# Patient Record
Sex: Male | Born: 1995 | Race: Black or African American | Hispanic: No | Marital: Single | Smoking: Current every day smoker
Health system: Southern US, Community
[De-identification: ages and names within clinical notes are randomized; demographics above are authoritative.]

## PROBLEM LIST (undated history)

## (undated) HISTORY — PX: KNEE ARTHROSCOPY: SUR90

---
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
## 2017-02-07 ENCOUNTER — Ambulatory Visit (HOSPITAL_COMMUNITY)
Admission: EM | Admit: 2017-02-07 | Discharge: 2017-02-07 | Disposition: A | Discharge status: Home / Self Care | Payer: Self-pay | Attending: Family Medicine | Admitting: Family Medicine

## 2017-02-07 ENCOUNTER — Encounter (HOSPITAL_COMMUNITY): Payer: Self-pay | Admitting: Emergency Medicine

## 2017-02-07 DIAGNOSIS — N342 Other urethritis: Secondary | ICD-10-CM | POA: Insufficient documentation

## 2017-02-07 DIAGNOSIS — A64 Unspecified sexually transmitted disease: Secondary | ICD-10-CM

## 2017-02-07 DIAGNOSIS — Z202 Contact with and (suspected) exposure to infections with a predominantly sexual mode of transmission: Secondary | ICD-10-CM | POA: Insufficient documentation

## 2017-02-07 DIAGNOSIS — F17210 Nicotine dependence, cigarettes, uncomplicated: Secondary | ICD-10-CM | POA: Insufficient documentation

## 2017-02-07 DIAGNOSIS — R350 Frequency of micturition: Secondary | ICD-10-CM | POA: Insufficient documentation

## 2017-02-07 LAB — POCT URINALYSIS DIP (DEVICE)
Bilirubin Urine: NEGATIVE
Glucose, UA: NEGATIVE mg/dL
HGB URINE DIPSTICK: NEGATIVE
Ketones, ur: NEGATIVE mg/dL
NITRITE: NEGATIVE
PROTEIN: NEGATIVE mg/dL
Specific Gravity, Urine: 1.025 (ref 1.005–1.030)
UROBILINOGEN UA: 1 mg/dL (ref 0.0–1.0)
pH: 6.5 (ref 5.0–8.0)

## 2017-02-07 MED ORDER — PHENAZOPYRIDINE HCL 200 MG PO TABS: 200.0000 mg | ORAL_TABLET | Freq: Three times a day (TID) | ORAL | 0 refills | Status: AC

## 2017-02-07 MED ORDER — AZITHROMYCIN 250 MG PO TABS
ORAL_TABLET | ORAL | Status: AC
  Filled 2017-02-07: qty 4

## 2017-02-07 MED ORDER — CEFTRIAXONE SODIUM 250 MG IJ SOLR
250.0000 mg | Freq: Once | INTRAMUSCULAR | Status: AC
  Administered 2017-02-07: 250 mg via INTRAMUSCULAR

## 2017-02-07 MED ORDER — LIDOCAINE HCL (PF) 1 % IJ SOLN
INTRAMUSCULAR | Status: AC
  Filled 2017-02-07: qty 2

## 2017-02-07 MED ORDER — CEFTRIAXONE SODIUM 250 MG IJ SOLR
INTRAMUSCULAR | Status: AC
  Filled 2017-02-07: qty 250

## 2017-02-07 MED ORDER — AZITHROMYCIN 250 MG PO TABS
1000.0000 mg | ORAL_TABLET | Freq: Once | ORAL | Status: AC
  Administered 2017-02-07: 1000 mg via ORAL

## 2017-02-07 NOTE — ED Provider Notes (Signed)
CSN: [id number]     Arrival date & time 02/07/17  1810 History   None    Chief Complaint  Patient presents with  . Urinary Frequency   (Consider location/radiation/quality/duration/timing/severity/associated sxs/prior Treatment) Patient c/o urinary frequency and bladder pressure for last 2 days.   The history is provided by the patient.  Exposure to STD  This is a new problem. The problem occurs constantly. The problem has not changed since onset.Nothing aggravates the symptoms. Nothing relieves the symptoms.    History reviewed. No pertinent past medical history. Past Surgical History:  Procedure Laterality Date  . KNEE ARTHROSCOPY Right    History reviewed. No pertinent family history. Social History  Substance Use Topics  . Smoking status: Current Every Day Smoker    Packs/day: 1.00    Years: 5.00  . Smokeless tobacco: Never Used  . Alcohol use No    Review of Systems  Constitutional: Negative.   HENT: Negative.   Eyes: Negative.   Respiratory: Negative.   Gastrointestinal: Negative.   Endocrine: Negative.   Genitourinary: Positive for frequency.  Musculoskeletal: Negative.   Allergic/Immunologic: Negative.   Neurological: Negative.     Allergies  Patient has no known allergies.  Home Medications   Prior to Admission medications   Medication Sig Start Date End Date Taking? Authorizing Provider  phenazopyridine (PYRIDIUM) 200 MG tablet Take 1 tablet (200 mg total) by mouth 3 (three) times daily. 02/07/17   Deatra Canterxford, William J, FNP   Meds Ordered and Administered this Visit   Medications  cefTRIAXone (ROCEPHIN) injection 250 mg (250 mg Intramuscular Given 02/07/17 1836)  azithromycin (ZITHROMAX) tablet 1,000 mg (1,000 mg Oral Given 02/07/17 1840)    BP 104/63 (BP Location: Left Arm)   Pulse 84   Temp 98.3 F (36.8 C) (Oral)   SpO2 98%  No data found.   Physical Exam  Constitutional: He appears well-developed and well-nourished.  HENT:  Head:  Normocephalic and atraumatic.  Eyes: Conjunctivae and EOM are normal. Pupils are equal, round, and reactive to light.  Neck: Normal range of motion. Neck supple.  Cardiovascular: Normal rate, regular rhythm and normal heart sounds.   Pulmonary/Chest: Effort normal and breath sounds normal.  Nursing note and vitals reviewed.   Urgent Care Course     Procedures (including critical care time)  Labs Review Labs Reviewed  POCT URINALYSIS DIP (DEVICE) - Abnormal; Notable for the following:       Result Value   Leukocytes, UA TRACE (*)    All other components within normal limits  URINE CYTOLOGY ANCILLARY ONLY    Imaging Review No results found.   Visual Acuity Review  Right Eye Distance:   Left Eye Distance:   Bilateral Distance:    Right Eye Near:   Left Eye Near:    Bilateral Near:         MDM   1. STD (male)   2. Urethritis    Azithromycin 250mg  x 4 Rocephin 250mg  IM  Pyridium 200mg  one po tid x 2 days #6      Deatra CanterOxford, William J, [facility] Memorial HospitalFNP 02/07/17 [id number]

## 2017-02-07 NOTE — ED Triage Notes (Signed)
Pt reports urinary frequency and bladder pressure for the last two days.

## 2017-02-08 LAB — URINE CYTOLOGY ANCILLARY ONLY
Chlamydia: NEGATIVE
Neisseria Gonorrhea: NEGATIVE
Trichomonas: NEGATIVE

## 2017-05-14 ENCOUNTER — Ambulatory Visit (HOSPITAL_COMMUNITY)
Admission: EM | Admit: 2017-05-14 | Discharge: 2017-05-14 | Disposition: A | Discharge status: Home / Self Care | Payer: Self-pay | Attending: Internal Medicine | Admitting: Internal Medicine

## 2017-05-14 ENCOUNTER — Encounter (HOSPITAL_COMMUNITY): Payer: Self-pay | Admitting: *Deleted

## 2017-05-14 DIAGNOSIS — Z202 Contact with and (suspected) exposure to infections with a predominantly sexual mode of transmission: Secondary | ICD-10-CM | POA: Insufficient documentation

## 2017-05-14 DIAGNOSIS — Z113 Encounter for screening for infections with a predominantly sexual mode of transmission: Secondary | ICD-10-CM

## 2017-05-14 DIAGNOSIS — L291 Pruritus scroti: Secondary | ICD-10-CM

## 2017-05-14 DIAGNOSIS — F17210 Nicotine dependence, cigarettes, uncomplicated: Secondary | ICD-10-CM | POA: Insufficient documentation

## 2017-05-14 LAB — POCT URINALYSIS DIP (DEVICE)
BILIRUBIN URINE: NEGATIVE
Glucose, UA: NEGATIVE mg/dL
Hgb urine dipstick: NEGATIVE
KETONES UR: NEGATIVE mg/dL
Leukocytes, UA: NEGATIVE
Nitrite: NEGATIVE
PH: 5.5 (ref 5.0–8.0)
PROTEIN: NEGATIVE mg/dL
SPECIFIC GRAVITY, URINE: 1.02 (ref 1.005–1.030)
Urobilinogen, UA: 0.2 mg/dL (ref 0.0–1.0)

## 2017-05-14 MED ORDER — NYSTATIN 100000 UNIT/GM EX POWD: Freq: Four times a day (QID) | CUTANEOUS | 0 refills | Status: AC

## 2017-05-14 MED ORDER — CLOTRIMAZOLE 1 % EX CREA: TOPICAL_CREAM | CUTANEOUS | 0 refills | Status: AC

## 2017-05-14 NOTE — ED Provider Notes (Signed)
[facility]    CSN: [id number] Arrival date & time: 05/14/17  1315     History   Chief Complaint Chief Complaint  Patient presents with  . Exposure to STD    HPI Ronald Turner is a 21 y.o. male.   Pt states that his girlfriend had "a bacterial infection" and he now has a rash on his scrotum. Denies dysuria or discharge. Denies genital ulcers       History reviewed. No pertinent past medical history.  There are no active problems to display for this patient.   Past Surgical History:  Procedure Laterality Date  . KNEE ARTHROSCOPY Right        Home Medications    Prior to Admission medications   Medication Sig Start Date End Date Taking? Authorizing Provider  clotrimazole (LOTRIMIN) 1 % cream Apply to affected area 2 times daily 05/14/17   Arnaldo Natal, MD  nystatin (MYCOSTATIN/NYSTOP) powder Apply topically 4 (four) times daily. 05/14/17   Arnaldo Natal, MD  phenazopyridine (PYRIDIUM) 200 MG tablet Take 1 tablet (200 mg total) by mouth 3 (three) times daily. 02/07/17   Deatra Canter, FNP    Family History History reviewed. No pertinent family history.  Social History Social History  Substance Use Topics  . Smoking status: Current Every Day Smoker    Packs/day: 1.00    Years: 5.00  . Smokeless tobacco: Never Used  . Alcohol use No     Allergies   Patient has no known allergies.   Review of Systems Review of Systems  Constitutional: Negative for chills and fever.  HENT: Negative for sore throat and tinnitus.   Eyes: Negative for redness.  Respiratory: Negative for cough and shortness of breath.   Cardiovascular: Negative for chest pain and palpitations.  Gastrointestinal: Negative for abdominal pain, diarrhea, nausea and vomiting.  Genitourinary: Negative for dysuria, frequency and urgency.  Musculoskeletal: Negative for myalgias.  Skin: Positive for rash (scrotum).       No lesions  Neurological: Negative for weakness.    Hematological: Does not bruise/bleed easily.  Psychiatric/Behavioral: Negative for suicidal ideas.     Physical Exam Triage Vital Signs ED Triage Vitals [05/14/17 1418]  Enc Vitals Group     BP 122/72     Pulse Rate 78     Resp 18     Temp 98.6 F (37 C)     Temp Source Oral     SpO2 100 %     Weight      Height      Head Circumference      Peak Flow      Pain Score      Pain Loc      Pain Edu?      Excl. in GC?    No data found.   Updated Vital Signs BP 122/72 (BP Location: Right Arm)   Pulse 78   Temp 98.6 F (37 C) (Oral)   Resp 18   SpO2 100%   Visual Acuity Right Eye Distance:   Left Eye Distance:   Bilateral Distance:    Right Eye Near:   Left Eye Near:    Bilateral Near:     Physical Exam  Constitutional: He is oriented to person, place, and time. He appears well-developed and well-nourished. No distress.  HENT:  Head: Normocephalic and atraumatic.  Mouth/Throat: Oropharynx is clear and moist.  Eyes: Pupils are equal, round, and reactive to light. Conjunctivae and EOM are normal. No  scleral icterus.  Neck: Normal range of motion. Neck supple. No JVD present. No tracheal deviation present. No thyromegaly present.  Cardiovascular: Normal rate, regular rhythm and normal heart sounds.  Exam reveals no gallop and no friction rub.   No murmur heard. Pulmonary/Chest: Effort normal and breath sounds normal. No respiratory distress.  Abdominal: Soft. Bowel sounds are normal. He exhibits no distension. There is no tenderness.  Musculoskeletal: Normal range of motion. He exhibits no edema.  Lymphadenopathy:    He has no cervical adenopathy.  Neurological: He is alert and oriented to person, place, and time. No cranial nerve deficit.  Skin: Skin is warm and dry. No rash noted. No erythema.  Psychiatric: He has a normal mood and affect. His behavior is normal. Judgment and thought content normal.     UC Treatments / Results  Labs (all labs ordered are  listed, but only abnormal results are displayed) Labs Reviewed  RPR  HEPATITIS PANEL, ACUTE  HIV ANTIBODY (ROUTINE TESTING)  POCT URINALYSIS DIP (DEVICE)  URINE CYTOLOGY ANCILLARY ONLY    EKG  EKG Interpretation None       Radiology No results found.  Procedures Procedures (including critical care time)  Medications Ordered in UC Medications - No data to display   Initial Impression / Assessment and Plan / UC Course  I have reviewed the triage vital signs and the nursing notes.  Pertinent labs & imaging results that were available during my care of the patient were reviewed by me and considered in my medical decision making (see chart for details).     Empirically treat tinea crura. Screen for HIV, Hep B/C, RPR, GC/Chlamydia. Will notify patient of results  Final Clinical Impressions(s) / UC Diagnoses   Final diagnoses:  STD exposure    New Prescriptions New Prescriptions   CLOTRIMAZOLE (LOTRIMIN) 1 % CREAM    Apply to affected area 2 times daily   NYSTATIN (MYCOSTATIN/NYSTOP) POWDER    Apply topically 4 (four) times daily.     Controlled Substance Prescriptions [place] Controlled Substance Registry consulted? Not Applicable   Arnaldo Natal, MD 05/14/17 [id number]

## 2017-05-14 NOTE — ED Triage Notes (Signed)
Pt  Reports     His   Girlfriend  Has  A  Bacterial    Infection  He  Denies  Any  Symptoms  Other   Than a  Rash      In  Private  Area

## 2017-05-15 LAB — RPR: RPR Ser Ql: NONREACTIVE

## 2017-05-15 LAB — HEPATITIS PANEL, ACUTE
HCV Ab: <0.1 s/co ratio (ref 0.0–0.9)
Hep A IgM: NEGATIVE
Hep B C IgM: NEGATIVE
Hepatitis B Surface Ag: NEGATIVE

## 2017-05-15 LAB — HIV ANTIBODY (ROUTINE TESTING W REFLEX): HIV Screen 4th Generation wRfx: NONREACTIVE

## 2017-05-17 LAB — URINE CYTOLOGY ANCILLARY ONLY
Chlamydia: NEGATIVE
Neisseria Gonorrhea: NEGATIVE
Trichomonas: NEGATIVE

## 2017-12-28 ENCOUNTER — Encounter (HOSPITAL_COMMUNITY): Payer: Self-pay | Admitting: Emergency Medicine

## 2017-12-28 ENCOUNTER — Other Ambulatory Visit: Payer: Self-pay

## 2017-12-28 ENCOUNTER — Emergency Department (HOSPITAL_COMMUNITY)
Admission: EM | Admit: 2017-12-28 | Discharge: 2017-12-28 | Disposition: A | Discharge status: Home / Self Care | Payer: Self-pay | Attending: Emergency Medicine | Admitting: Emergency Medicine

## 2017-12-28 DIAGNOSIS — F17210 Nicotine dependence, cigarettes, uncomplicated: Secondary | ICD-10-CM | POA: Insufficient documentation

## 2017-12-28 DIAGNOSIS — Z79899 Other long term (current) drug therapy: Secondary | ICD-10-CM | POA: Insufficient documentation

## 2017-12-28 DIAGNOSIS — J069 Acute upper respiratory infection, unspecified: Secondary | ICD-10-CM | POA: Insufficient documentation

## 2017-12-28 LAB — GROUP A STREP BY PCR: GROUP A STREP BY PCR: NOT DETECTED

## 2017-12-28 MED ORDER — PENICILLIN G BENZATHINE & PROC 1200000 UNIT/2ML IM SUSP
1.2000 10*6.[IU] | Freq: Once | INTRAMUSCULAR | Status: AC
  Administered 2017-12-28: 1.2 10*6.[IU] via INTRAMUSCULAR
  Filled 2017-12-28: qty 2

## 2017-12-28 MED ORDER — ACETAMINOPHEN 325 MG PO TABS: 650.0000 mg | ORAL_TABLET | Freq: Four times a day (QID) | ORAL | 0 refills | Status: AC | PRN

## 2017-12-28 MED ORDER — IBUPROFEN 400 MG PO TABS: 400.0000 mg | ORAL_TABLET | Freq: Four times a day (QID) | ORAL | 0 refills | Status: AC | PRN

## 2017-12-28 NOTE — Discharge Instructions (Signed)
You may alternate taking Tylenol and Ibuprofen as needed for pain control. You may take 400-600 mg of ibuprofen every 6 hours and [phone] mg of Tylenol every 6 hours. Do not exceed 4000 mg of Tylenol daily as this can lead to liver damage. Also, make sure to take Ibuprofen with meals as it can cause an upset stomach. Do not take other NSAIDs while taking Ibuprofen such as (Aleve, Naprosyn, Aspirin, Celebrex, etc) and do not take more than the prescribed dose as this can lead to ulcers and bleeding in your GI tract.   Over the next several days you should rest as much as possible, and drink more fluids than usual.   To help soothe a sore throat gargle with warm salt water.  Make salt water by dissolving  teaspoon salt in 1 cup warm water. You may also use throat lozenges and over the counter sore throat spray.  Please follow up with your primary care provider within 5-7 days for re-evaluation of your symptoms. If you do not have a primary care provider, information for a healthcare clinic has been provided for you to make arrangements for follow up care. Please return to the emergency department for any persistent fevers, worsening sore throat/hoarse voice, inability to swallow, persistent vomiting, chest pain, shortness of breath, coughing up blood, or any new or worsening symptoms.

## 2017-12-28 NOTE — ED Triage Notes (Signed)
Pt reports sore throat and generalized fatigue since yesterday. Denies cough.

## 2017-12-28 NOTE — ED Provider Notes (Signed)
[facility] EMERGENCY DEPARTMENT Provider Note   CSN: [id number] Arrival date & time: 12/28/17  1013     History   Chief Complaint Chief Complaint  Patient presents with  . URI    HPI Ronald Turner is a 22 y.o. male.  HPI   Is a 22 year old male presents the ED today to be evaluated for a sore throat that began yesterday.  Patient states that he began to have a gradual onset headache yesterday and felt fatigued.  This is not the worst headache of life it was diffuse in nature.  It is since resolved completely.  After onset of headache he started to develop a sore throat as well.  Denies associated rhinorrhea, congestion, cough, shortness of breath, chest pain.  Denies abdominal pain, nausea, vomiting, diarrhea or urinary symptoms.  Denies bilateral ear pain.  Denies urinary or bowel symptoms.  States he has not been around anyone with similar symptoms.  He does have a 72-month-old at home.  He did not receive his flu shot this year.  He denies any fevers.  Denies any neck stiffness. Denies dizziness, vision changes, numbness, weakness.   History reviewed. No pertinent past medical history.  There are no active problems to display for this patient.   Past Surgical History:  Procedure Laterality Date  . KNEE ARTHROSCOPY Right         Home Medications    Prior to Admission medications   Medication Sig Start Date End Date Taking? Authorizing Provider  acetaminophen (TYLENOL) 325 MG tablet Take 2 tablets (650 mg total) by mouth every 6 (six) hours as needed. Do not take more than  of tylenol per day 12/28/17   Eitan Doubleday S, PA-C  clotrimazole (LOTRIMIN) 1 % cream Apply to affected area 2 times daily 05/14/17   Arnaldo Natal, MD  ibuprofen (ADVIL,MOTRIN) 400 MG tablet Take 1 tablet (400 mg total) by mouth every 6 (six) hours as needed. 12/28/17   Hadlea Furuya S, PA-C  nystatin (MYCOSTATIN/NYSTOP) powder Apply topically 4 (four) times daily. 05/14/17    Arnaldo Natal, MD  phenazopyridine (PYRIDIUM) 200 MG tablet Take 1 tablet (200 mg total) by mouth 3 (three) times daily. 02/07/17   Deatra Canter, FNP    Family History No family history on file.  Social History Social History   Tobacco Use  . Smoking status: Current Every Day Smoker    Packs/day: 1.00    Years: 5.00    Pack years: 5.00  . Smokeless tobacco: Never Used  Substance Use Topics  . Alcohol use: No  . Drug use: No     Allergies   Patient has no known allergies.   Review of Systems Review of Systems  Constitutional: Positive for fatigue. Negative for chills and fever.  HENT: Positive for sore throat. Negative for congestion, ear pain and rhinorrhea.   Eyes: Negative for pain and visual disturbance.  Respiratory: Negative for cough and shortness of breath.   Cardiovascular: Negative for chest pain and palpitations.  Gastrointestinal: Negative for abdominal pain, constipation, diarrhea, nausea and vomiting.  Genitourinary: Negative for dysuria and hematuria.  Musculoskeletal: Negative for arthralgias, back pain and neck stiffness.  Skin: Negative for color change and rash.  Neurological: Positive for headaches (resolved). Negative for dizziness, seizures, syncope, weakness and numbness.  All other systems reviewed and are negative.  Physical Exam Updated Vital Signs BP 139/73   Pulse 98   Temp 98.2 F (36.8 C) (Oral)   Resp  15   SpO2 99%   Physical Exam  Constitutional: He appears well-developed and well-nourished.  HENT:  Head: Normocephalic and atraumatic.  Bilateral TMs without erythema or effusion.  Pharyngeal erythema noted.  Tonsils 1+ bilaterally.  No tonsillar exudates noted.  No evidence of PTA.  Normal voice.  Tolerating secretions.  Patient airway.  Nose normal.  Eyes: Pupils are equal, round, and reactive to light. Conjunctivae and EOM are normal.  Neck: Normal range of motion. Neck supple.  No nuchal rigidity  Cardiovascular:  Normal rate, regular rhythm and normal heart sounds.  No murmur heard. Pulmonary/Chest: Effort normal and breath sounds normal. No respiratory distress. He has no wheezes.  Abdominal: Soft. Bowel sounds are normal. He exhibits no distension. There is no tenderness. There is no guarding.  Musculoskeletal: He exhibits no edema.  Lymphadenopathy:    He has cervical adenopathy.  Neurological: He is alert.  Mental Status:  Alert, thought content appropriate, able to give a coherent history. Speech fluent without evidence of aphasia. Able to follow 2 step commands without difficulty.  Cranial Nerves:  II: pupils equal, round, reactive to light III,IV, VI: ptosis not present, extra-ocular motions intact bilaterally  V,VII: smile symmetric, facial light touch sensation equal VIII: hearing grossly normal to voice  X: uvula elevates symmetrically  XI: bilateral shoulder shrug symmetric and strong XII: midline tongue extension without fassiculations Motor:  Normal tone. 5/5 strength of BUE and BLE major muscle groups including strong and equal grip strength and dorsiflexion/plantar flexion Sensory: light touch normal in all extremities. CV: 2+ radial and DP/PT pulses  Skin: Skin is warm and dry.  Psychiatric: He has a normal mood and affect.  Nursing note and vitals reviewed.  ED Treatments / Results  Labs (all labs ordered are listed, but only abnormal results are displayed) Labs Reviewed  GROUP A STREP BY PCR    EKG None  Radiology No results found.  Procedures Procedures (including critical care time)  Medications Ordered in ED Medications  penicillin g procaine-penicillin g benzathine (BICILLIN-CR) injection 600000-600000 units (has no administration in time range)     Initial Impression / Assessment and Plan / ED Course  I have reviewed the triage vital signs and the nursing notes.  Pertinent labs & imaging results that were available during my care of the patient were  reviewed by me and considered in my medical decision making (see chart for details).     Final Clinical Impressions(s) / ED Diagnoses   Final diagnoses:  Upper respiratory tract infection, unspecified type   Patient presenting with sore throat for 2 days.  He is afebrile in the ED with normal vital signs.  He is nontoxic-appearing and in no acute distress.  Does have some pharyngeal erythema and cervical adenopathy.  Because he does not have a cough and he does have pharyngeal erythema and cervical adenopathy will likely treat for suspected strep throat with IM penicillin.  Strep was negative however may be false negative.  Patient has no evidence of PTA or retropharyngeal abscess.  No meningeal signs.  Normal neurologic exam.  Have advised him to follow-up if he does not feel better within the next several days.  Gave resources for primary care providers and gave strict return precautions for any new or worsening symptoms.  Advised Tylenol and ibuprofen for symptoms of sore throat.  All questions answered patient understands the plan.  ED Discharge Orders        Ordered    acetaminophen (TYLENOL) 325  MG tablet  Every 6 hours PRN     12/28/17 1245    ibuprofen (ADVIL,MOTRIN) 400 MG tablet  Every 6 hours PRN     12/28/17 1245       Lamira Borin S, PA-C 12/28/17 1246    Pricilla Loveless, MD 12/29/17 [phone]

## 2020-01-01 ENCOUNTER — Other Ambulatory Visit: Payer: Self-pay

## 2020-01-01 ENCOUNTER — Emergency Department (HOSPITAL_COMMUNITY): Admission: EM | Admit: 2020-01-01 | Discharge: 2020-01-01 | Discharge status: Against Medical Advice | Payer: Self-pay

## 2020-01-01 NOTE — ED Notes (Signed)
Pt called multiple times for triage, no answer. 

## 2021-03-11 ENCOUNTER — Emergency Department (HOSPITAL_COMMUNITY): Admission: EM | Admit: 2021-03-11 | Discharge: 2021-03-11 | Discharge status: Against Medical Advice | Payer: Self-pay

## 2021-03-11 ENCOUNTER — Other Ambulatory Visit: Payer: Self-pay

## 2021-03-12 ENCOUNTER — Emergency Department (HOSPITAL_BASED_OUTPATIENT_CLINIC_OR_DEPARTMENT_OTHER)
Admission: EM | Admit: 2021-03-12 | Discharge: 2021-03-12 | Disposition: A | Discharge status: Home / Self Care | Payer: Self-pay | Attending: Emergency Medicine | Admitting: Emergency Medicine

## 2021-03-12 ENCOUNTER — Encounter (HOSPITAL_BASED_OUTPATIENT_CLINIC_OR_DEPARTMENT_OTHER): Payer: Self-pay | Admitting: Emergency Medicine

## 2021-03-12 DIAGNOSIS — W228XXA Striking against or struck by other objects, initial encounter: Secondary | ICD-10-CM | POA: Insufficient documentation

## 2021-03-12 DIAGNOSIS — S01112A Laceration without foreign body of left eyelid and periocular area, initial encounter: Secondary | ICD-10-CM

## 2021-03-12 DIAGNOSIS — F17210 Nicotine dependence, cigarettes, uncomplicated: Secondary | ICD-10-CM | POA: Insufficient documentation

## 2021-03-12 MED ORDER — ERYTHROMYCIN 5 MG/GM OP OINT
TOPICAL_OINTMENT | Freq: Once | OPHTHALMIC | Status: AC
  Administered 2021-03-12: 1 via OPHTHALMIC
  Filled 2021-03-12: qty 3.5

## 2021-03-12 NOTE — ED Notes (Signed)
Small lac noted to upper left eyelid

## 2021-03-12 NOTE — Discharge Instructions (Addendum)
Apply erythromycin ointment to the laceration twice daily for the next 3 days.  Return to the ER if you develop any new and/or concerning symptoms.
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
## 2021-11-02 ENCOUNTER — Ambulatory Visit (HOSPITAL_COMMUNITY)
Admission: EM | Admit: 2021-11-02 | Discharge: 2021-11-02 | Disposition: A | Discharge status: Home / Self Care | Payer: Self-pay | Attending: Emergency Medicine | Admitting: Emergency Medicine

## 2021-11-02 ENCOUNTER — Other Ambulatory Visit: Payer: Self-pay

## 2021-11-02 ENCOUNTER — Encounter (HOSPITAL_COMMUNITY): Payer: Self-pay | Admitting: Emergency Medicine

## 2021-11-02 DIAGNOSIS — L0231 Cutaneous abscess of buttock: Secondary | ICD-10-CM

## 2021-11-02 MED ORDER — SULFAMETHOXAZOLE-TRIMETHOPRIM 800-160 MG PO TABS: 2.0000 | ORAL_TABLET | Freq: Two times a day (BID) | ORAL | 0 refills | Status: AC

## 2021-11-02 NOTE — Discharge Instructions (Addendum)
Please begin Bactrim, 2 tablets twice daily for the next 7 days.  If you have not had meaningful improvement of the lesion after taking 5 days of antibiotics, please return to urgent care for repeat evaluation.  You can come to the [facility] location or you can come see me at the [facility] location at [place]. [employer]. ? ?Thank you for visiting urgent care today. ?

## 2021-11-02 NOTE — ED Provider Notes (Signed)
?[facility] ? ? ? ?CSN: [id number] ?Arrival date & time: 11/02/21  1210 ?  ? ?HISTORY  ? ?Chief Complaint  ?Patient presents with  ? Abscess  ? ?HPI ?Ronald Turner is a 26 y.o. male. Patient complains of a red pimple on the left side of his inner buttock for the past 2 days.  Patient states he has not attempted to pop it or treated in any way.  Patient states it is continued to get larger and more painful.  Patient states he has not looked at it.  Patient denies history of recurrent abscesses or skin lesions.  Patient states he has never had a lesion in this area before. ? ?The history is provided by the patient.  ?History reviewed. No pertinent past medical history. ?There are no problems to display for this patient. ? ?Past Surgical History:  ?Procedure Laterality Date  ? KNEE ARTHROSCOPY Right   ? ? ?Home Medications   ? ?Prior to Admission medications   ?Medication Sig Start Date End Date Taking? Authorizing Provider  ?acetaminophen (TYLENOL) 325 MG tablet Take 2 tablets (650 mg total) by mouth every 6 (six) hours as needed. Do not take more than 4000mg  of tylenol per day 12/28/17   Couture, Cortni S, PA-C  ?clotrimazole (LOTRIMIN) 1 % cream Apply to affected area 2 times daily 05/14/17   05/16/17, MD  ?ibuprofen (ADVIL,MOTRIN) 400 MG tablet Take 1 tablet (400 mg total) by mouth every 6 (six) hours as needed. 12/28/17   Couture, Cortni S, PA-C  ?nystatin (MYCOSTATIN/NYSTOP) powder Apply topically 4 (four) times daily. 05/14/17   05/16/17, MD  ?phenazopyridine (PYRIDIUM) 200 MG tablet Take 1 tablet (200 mg total) by mouth 3 (three) times daily. 02/07/17   02/09/17, FNP  ? ? ?Family History ?Family History  ?Problem Relation Age of Onset  ? Healthy Mother   ? Healthy Father   ? ?Social History ?Social History  ? ?Tobacco Use  ? Smoking status: Every Day  ?  Packs/day: 1.00  ?  Years: 5.00  ?  Pack years: 5.00  ?  Types: Cigarettes  ? Smokeless tobacco: Never  ?Substance Use  Topics  ? Alcohol use: No  ? Drug use: No  ? ?Allergies   ?Patient has no known allergies. ? ?Review of Systems ?Review of Systems ?Pertinent findings noted in history of present illness.  ? ?Physical Exam ?Triage Vital Signs ?ED Triage Vitals  ?Enc Vitals Group  ?   BP 06/13/21 0827 (!) 147/82  ?   Pulse Rate 06/13/21 0827 72  ?   Resp 06/13/21 0827 18  ?   Temp 06/13/21 0827 98.3 ?F (36.8 ?C)  ?   Temp Source 06/13/21 0827 Oral  ?   SpO2 06/13/21 0827 98 %  ?   Weight --   ?   Height --   ?   Head Circumference --   ?   Peak Flow --   ?   Pain Score 06/13/21 0826 5  ?   Pain Loc --   ?   Pain Edu? --   ?   Excl. in GC? --   ?No data found. ? ?Updated Vital Signs ?BP 132/62 (BP Location: Right Arm)   Pulse 84   Temp 98.1 ?F (36.7 ?C) (Oral)   Resp 16   Ht 5\' 6"  (1.676 m)   Wt 154 lb 15.7 oz (70.3 kg)   SpO2 98%   BMI 25.01 kg/m?  ? ?  Physical Exam ?Vitals and nursing note reviewed.  ?Constitutional:   ?   General: He is not in acute distress. ?   Appearance: Normal appearance. He is not ill-appearing.  ?HENT:  ?   Head: Normocephalic and atraumatic.  ?Eyes:  ?   General: Lids are normal.     ?   Right eye: No discharge.     ?   Left eye: No discharge.  ?   Extraocular Movements: Extraocular movements intact.  ?   Conjunctiva/sclera: Conjunctivae normal.  ?   Right eye: Right conjunctiva is not injected.  ?   Left eye: Left conjunctiva is not injected.  ?Neck:  ?   Trachea: Trachea and phonation normal.  ?Cardiovascular:  ?   Rate and Rhythm: Normal rate and regular rhythm.  ?   Pulses: Normal pulses.  ?   Heart sounds: Normal heart sounds. No murmur heard. ?  No friction rub. No gallop.  ?Pulmonary:  ?   Effort: Pulmonary effort is normal. No accessory muscle usage, prolonged expiration or respiratory distress.  ?   Breath sounds: Normal breath sounds. No stridor, decreased air movement or transmitted upper airway sounds. No decreased breath sounds, wheezing, rhonchi or rales.  ?Chest:  ?   Chest wall: No  tenderness.  ?Musculoskeletal:     ?   General: Normal range of motion.  ?   Cervical back: Normal range of motion and neck supple. Normal range of motion.  ?Lymphadenopathy:  ?   Cervical: No cervical adenopathy.  ?Skin: ?   General: Skin is warm and dry.  ?   Findings: Lesion (Inner aspect of left buttock 2 cm superior to anus without punctate, area is erythematous and warm to touch, tender to palpation) present. No erythema or rash.  ?Neurological:  ?   General: No focal deficit present.  ?   Mental Status: He is alert and oriented to person, place, and time.  ?Psychiatric:     ?   Mood and Affect: Mood normal.     ?   Behavior: Behavior normal.  ? ? ?Visual Acuity ?Right Eye Distance:   ?Left Eye Distance:   ?Bilateral Distance:   ? ?Right Eye Near:   ?Left Eye Near:    ?Bilateral Near:    ? ?UC Couse / Diagnostics / Procedures:  ?  ?EKG ? ?Radiology ?No results found. ? ?Procedures ?Procedures (including critical care time) ? ?UC Diagnoses / Final Clinical Impressions(s)   ?I have reviewed the triage vital signs and the nursing notes. ? ?Pertinent labs & imaging results that were available during my care of the patient were reviewed by me and considered in my medical decision making (see chart for details).   ? ?Final diagnoses:  ?Abscess of buttock, left  ? ?No punctate appreciated, lesion is not fluctuant, I&D not recommended.  Patient provided with prescription for Bactrim for 7 days with return precautions. ? ?ED Prescriptions   ? ? Medication Sig Dispense Auth. Provider  ? sulfamethoxazole-trimethoprim (BACTRIM DS) 800-160 MG tablet Take 2 tablets by mouth 2 (two) times daily for 7 days. 28 tablet Theadora RamaMorgan, Lujain Kraszewski Scales, PA-C  ? ?  ? ?PDMP not reviewed this encounter. ? ?Pending results:  ?Labs Reviewed - No data to display ? ?Medications Ordered in UC: ?Medications - No data to display ? ?Disposition Upon Discharge:  ?Condition: stable for discharge home ?Home: take medications as prescribed; routine  discharge instructions as discussed; follow up as advised. ? ?Patient presented with an  acute illness with associated systemic symptoms and significant discomfort requiring urgent management. In my opinion, this is a condition that a prudent lay person (someone who possesses an average knowledge of health and medicine) may potentially expect to result in complications if not addressed urgently such as respiratory distress, impairment of bodily function or dysfunction of bodily organs.  ? ?Routine symptom specific, illness specific and/or disease specific instructions were discussed with the patient and/or caregiver at length.  ? ?As such, the patient has been evaluated and assessed, work-up was performed and treatment was provided in alignment with urgent care protocols and evidence based medicine.  Patient/parent/caregiver has been advised that the patient may require follow up for further testing and treatment if the symptoms continue in spite of treatment, as clinically indicated and appropriate. ? ?If the patient was tested for COVID-19, Influenza and/or RSV, then the patient/parent/guardian was advised to isolate at home pending the results of his/her diagnostic coronavirus test and potentially longer if they?re positive. I have also advised pt that if his/her COVID-19 test returns positive, it's recommended to self-isolate for at least 10 days after symptoms first appeared AND until fever-free for 24 hours without fever reducer AND other symptoms have improved or resolved. Discussed self-isolation recommendations as well as instructions for household member/close contacts as per the [facility] and [place] DHHS, and also gave patient the COVID packet with this information. ? ?Patient/parent/caregiver has been advised to return to the [facility] or PCP in 3-5 days if no better; to PCP or the Emergency Department if new signs and symptoms develop, or if the current signs or symptoms continue to change or worsen for further workup,  evaluation and treatment as clinically indicated and appropriate ? ?The patient will follow up with their current PCP if and as advised. If the patient does not currently have a PCP we will assist them in obtaining one

## 2021-11-02 NOTE — ED Triage Notes (Signed)
Pt reports a "red pimple" on his buttocks x 2 days.  ?

## 2021-12-19 ENCOUNTER — Emergency Department (HOSPITAL_BASED_OUTPATIENT_CLINIC_OR_DEPARTMENT_OTHER)
Admission: EM | Admit: 2021-12-19 | Discharge: 2021-12-19 | Disposition: A | Discharge status: Home / Self Care | Payer: Medicaid Other | Attending: Emergency Medicine | Admitting: Emergency Medicine

## 2021-12-19 ENCOUNTER — Other Ambulatory Visit: Payer: Self-pay

## 2021-12-19 ENCOUNTER — Encounter (HOSPITAL_BASED_OUTPATIENT_CLINIC_OR_DEPARTMENT_OTHER): Payer: Self-pay | Admitting: Emergency Medicine

## 2021-12-19 DIAGNOSIS — Z20822 Contact with and (suspected) exposure to covid-19: Secondary | ICD-10-CM | POA: Insufficient documentation

## 2021-12-19 DIAGNOSIS — J069 Acute upper respiratory infection, unspecified: Secondary | ICD-10-CM | POA: Insufficient documentation

## 2021-12-19 LAB — RESP PANEL BY RT-PCR (FLU A&B, COVID) ARPGX2
Influenza A by PCR: NEGATIVE
Influenza B by PCR: NEGATIVE
SARS Coronavirus 2 by RT PCR: NEGATIVE

## 2021-12-19 NOTE — ED Triage Notes (Signed)
Cough , nasal congestion x 1 day .  ?

## 2021-12-19 NOTE — ED Provider Notes (Signed)
?[facility] EMERGENCY DEPARTMENT ?Provider Note ? ? ?CSN: [id number] ?Arrival date & time: 12/19/21  1308 ? ?  ? ?History ? ?Chief Complaint  ?Patient presents with  ? Cough  ? ? ?Carr Shartzer is a 26 y.o. male increased fatigue since last night.  Says his daughter has the same symptoms.  No other known sick contacts.  Has not tried anything over-the-counter.  Cough occasionally productive of clear sputum ? ? ?Cough ?Associated symptoms: myalgias   ? ?  ? ?Home Medications ?Prior to Admission medications   ?Medication Sig Start Date End Date Taking? Authorizing Provider  ?acetaminophen (TYLENOL) 325 MG tablet Take 2 tablets (650 mg total) by mouth every 6 (six) hours as needed. Do not take more than 4000mg  of tylenol per day 12/28/17   Couture, Cortni S, PA-C  ?clotrimazole (LOTRIMIN) 1 % cream Apply to affected area 2 times daily 05/14/17   05/16/17, MD  ?ibuprofen (ADVIL,MOTRIN) 400 MG tablet Take 1 tablet (400 mg total) by mouth every 6 (six) hours as needed. 12/28/17   Couture, Cortni S, PA-C  ?nystatin (MYCOSTATIN/NYSTOP) powder Apply topically 4 (four) times daily. 05/14/17   05/16/17, MD  ?phenazopyridine (PYRIDIUM) 200 MG tablet Take 1 tablet (200 mg total) by mouth 3 (three) times daily. 02/07/17   02/09/17, FNP  ?   ? ?Allergies    ?Patient has no known allergies.   ? ?Review of Systems   ?Review of Systems  ?HENT:  Positive for congestion.   ?Respiratory:  Positive for cough and chest tightness.   ?Musculoskeletal:  Positive for myalgias.  ?Neurological:  Positive for weakness.  ? ?Physical Exam ?Updated Vital Signs ?BP 108/80 (BP Location: Left Arm)   Pulse (!) 115   Temp 98.7 ?F (37.1 ?C)   Resp 17   Ht 5\' 6"  (1.676 m)   Wt 70.3 kg   SpO2 98%   BMI 25.02 kg/m?  ?Physical Exam ?Vitals and nursing note reviewed.  ?Constitutional:   ?   Appearance: Normal appearance. He is not ill-appearing.  ?HENT:  ?   Head: Normocephalic and atraumatic.  ?   Mouth/Throat:  ?    Mouth: Mucous membranes are moist.  ?   Pharynx: Oropharynx is clear.  ?Eyes:  ?   General: No scleral icterus. ?   Conjunctiva/sclera: Conjunctivae normal.  ?Cardiovascular:  ?   Rate and Rhythm: Normal rate and regular rhythm.  ?Pulmonary:  ?   Effort: Pulmonary effort is normal. No respiratory distress.  ?   Breath sounds: No wheezing.  ?Skin: ?   Findings: No rash.  ?Neurological:  ?   Mental Status: He is alert.  ?Psychiatric:     ?   Mood and Affect: Mood normal.  ? ? ?ED Results / Procedures / Treatments   ?Labs ?(all labs ordered are listed, but only abnormal results are displayed) ?Labs Reviewed  ?RESP PANEL BY RT-PCR (FLU A&B, COVID) ARPGX2  ? ? ?EKG ?None ? ?Radiology ?No results found. ? ?Procedures ?Procedures  ? ?Medications Ordered in ED ?Medications - No data to display ? ?ED Course/ Medical Decision Making/ A&P ?  ?                        ?Medical Decision Making ? ?26 year old male presenting with his daughter due to URI symptoms.  Physical exam stable, he was asleep for the majority of his evaluation.  Negative for flu and COVID.  Discharged with symptomatic care and follow-up with PCP as needed ? ? ?Final Clinical Impression(s) / ED Diagnoses ?Final diagnoses:  ?Viral URI with cough  ? ? ?Rx / DC Orders ?Results and diagnoses were explained to the patient. Return precautions discussed in full. Patient had no additional questions and expressed complete understanding. ? ? ?This chart was dictated using voice recognition software.  Despite best efforts to proofread,  errors can occur which can change the documentation meaning.  ?  ?Saddie Benders, PA-C ?12/19/21 1533 ? ?  ?Sloan Leiter, DO ?12/20/21 1212 ? ?

## 2021-12-19 NOTE — Discharge Instructions (Signed)
Use over-the-counter medications.  Delsym is a good medication for cough.  Mucinex is a good 1 for congestion.  DayQuil and NyQuil are good medications for your overall cold and flu symptoms, including your body aches. ?

## 2023-10-15 ENCOUNTER — Ambulatory Visit: Payer: Medicaid Other

## 2023-12-04 ENCOUNTER — Ambulatory Visit

## 2023-12-08 ENCOUNTER — Ambulatory Visit
